# Patient Record
Sex: Male | Born: 1960 | Hispanic: No | Marital: Married | Smoking: Never smoker
Health system: Southern US, Community
[De-identification: ages and names within clinical notes are randomized; demographics above are authoritative.]

## PROBLEM LIST (undated history)

## (undated) DIAGNOSIS — I10 Essential (primary) hypertension: Secondary | ICD-10-CM

## (undated) DIAGNOSIS — D496 Neoplasm of unspecified behavior of brain: Secondary | ICD-10-CM

## (undated) HISTORY — PX: TONSILLECTOMY: SUR1361

---

## 2013-07-14 HISTORY — PX: CERVICAL SPINE SURGERY: SHX589

## 2013-07-24 ENCOUNTER — Emergency Department (HOSPITAL_COMMUNITY): Payer: BC Managed Care – PPO

## 2013-07-24 ENCOUNTER — Emergency Department (HOSPITAL_COMMUNITY)
Admission: EM | Admit: 2013-07-24 | Discharge: 2013-07-25 | Disposition: A | Discharge status: Home / Self Care | Payer: BC Managed Care – PPO | Attending: Emergency Medicine | Admitting: Emergency Medicine

## 2013-07-24 ENCOUNTER — Encounter (HOSPITAL_COMMUNITY): Payer: Self-pay | Admitting: Emergency Medicine

## 2013-07-24 DIAGNOSIS — S0993XA Unspecified injury of face, initial encounter: Secondary | ICD-10-CM | POA: Insufficient documentation

## 2013-07-24 DIAGNOSIS — Z79899 Other long term (current) drug therapy: Secondary | ICD-10-CM | POA: Insufficient documentation

## 2013-07-24 DIAGNOSIS — R5381 Other malaise: Secondary | ICD-10-CM | POA: Insufficient documentation

## 2013-07-24 DIAGNOSIS — IMO0002 Reserved for concepts with insufficient information to code with codable children: Secondary | ICD-10-CM | POA: Insufficient documentation

## 2013-07-24 DIAGNOSIS — Y93E1 Activity, personal bathing and showering: Secondary | ICD-10-CM | POA: Insufficient documentation

## 2013-07-24 DIAGNOSIS — R296 Repeated falls: Secondary | ICD-10-CM | POA: Insufficient documentation

## 2013-07-24 DIAGNOSIS — Z9889 Other specified postprocedural states: Secondary | ICD-10-CM | POA: Insufficient documentation

## 2013-07-24 DIAGNOSIS — S199XXA Unspecified injury of neck, initial encounter: Principal | ICD-10-CM

## 2013-07-24 DIAGNOSIS — W19XXXA Unspecified fall, initial encounter: Secondary | ICD-10-CM

## 2013-07-24 DIAGNOSIS — R5383 Other fatigue: Secondary | ICD-10-CM

## 2013-07-24 DIAGNOSIS — Y929 Unspecified place or not applicable: Secondary | ICD-10-CM | POA: Insufficient documentation

## 2013-07-24 LAB — URINALYSIS, ROUTINE W REFLEX MICROSCOPIC
Bilirubin Urine: NEGATIVE
Glucose, UA: NEGATIVE mg/dL
Hgb urine dipstick: NEGATIVE
Ketones, ur: NEGATIVE mg/dL
Leukocytes, UA: NEGATIVE
Nitrite: NEGATIVE
Protein, ur: NEGATIVE mg/dL
Specific Gravity, Urine: 1.017 (ref 1.005–1.030)
Urobilinogen, UA: 1 mg/dL (ref 0.0–1.0)
pH: 7.5 (ref 5.0–8.0)

## 2013-07-24 NOTE — ED Notes (Signed)
Patient transported to X-ray 

## 2013-07-24 NOTE — ED Notes (Signed)
Offered gown, but patient does not want a gown on.

## 2013-07-24 NOTE — ED Notes (Signed)
Spoke to Dr. Dorna Mai about removal of towel around neck for x-ray. MD allows removal, and notified Otila Kluver in [place].

## 2013-07-24 NOTE — ED Notes (Signed)
Removed stabliizer with Dr. Dorna Mai at the bedside.

## 2013-07-24 NOTE — ED Provider Notes (Signed)
CSN: [id number]     Arrival date & time 07/24/13  1830 History   First MD Initiated Contact with Patient 07/24/13 1931     Chief Complaint  Patient presents with  . Fall  . Post-op Problem     (Consider location/radiation/quality/duration/timing/severity/associated sxs/prior Treatment) HPI Comments: Pt tripped and fell in shower, partially aught by spouse and pt fell onto back.  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  Pt was in the hospital for 3 days, was walking improved at home, but family notes for the last day and a half, seems to have more trouble walking.  Legs seem weaker bilaterally, gait and balance are more difficult. Pt has only been taking advil for pain, avoiding oxycodone.  After fall, neck pain is not much worse, does not feel weaker or more numb to him subjectively.  Pt has weakness to right distal hand and wrist following surgery and seems to be the same to him.  No CP, HA, nausea.    Patient is a 53 y.o. male presenting with fall. The history is provided by the patient, the spouse and a relative.  Fall Pertinent negatives include no abdominal pain and no shortness of breath.    History reviewed. No pertinent past medical history. Past Surgical History  Procedure Laterality Date  . Cervical spine surgery  07/14/2013    Removed 1 Tumor  . Tonsillectomy     History reviewed. No pertinent family history. History  Substance Use Topics  . Smoking status: Never Smoker   . Smokeless tobacco: Not on file  . Alcohol Use: 1.8 oz/week    3 Cans of beer per week    Review of Systems  Constitutional: Negative for fever and chills.  Respiratory: Negative for shortness of breath.   Gastrointestinal: Negative for nausea, vomiting and abdominal pain.  Musculoskeletal: Positive for neck pain. Negative for back pain.  Skin: Negative for color change and rash.   Neurological: Positive for weakness. Negative for syncope.  All other systems reviewed and are negative.     Allergies  Review of patient's allergies indicates no known allergies.  Home Medications   Prior to Admission medications   Medication Sig Start Date End Date Taking? Authorizing Provider  baclofen (LIORESAL) 10 MG tablet Take 10 mg by mouth 3 (three) times daily. 07/20/13  Yes Historical Provider, MD  dexamethasone (DECADRON) 0.5 MG tablet Take 0.5 mg by mouth every 6 (six) hours.  07/17/13  Yes Historical Provider, MD  gabapentin (NEURONTIN) 400 MG capsule Take 400 mg by mouth 2 (two) times daily. 06/24/13  Yes Historical Provider, MD  ibuprofen (ADVIL,MOTRIN) 200 MG tablet Take 600 mg by mouth daily as needed for moderate pain.   Yes Historical Provider, MD  losartan (COZAAR) 100 MG tablet Take 100 mg by mouth daily. 05/26/13  Yes Historical Provider, MD  Multiple Vitamin (MULTIVITAMIN) capsule Take 1 capsule by mouth daily.   Yes Historical Provider, MD  Omega-3 Fatty Acids (FISH OIL PO) Take 1 capsule by mouth daily.   Yes Historical Provider, MD  oxyCODONE (OXY IR/ROXICODONE) 5 MG immediate release tablet Take 5 mg by mouth every 4 (four) hours as needed for severe pain.  07/17/13   Historical Provider, MD   BP 145/74  Pulse 108  Temp(Src) 99 F (37.2 C) (Oral)  Resp 18  SpO2 95% Physical Exam  Nursing note and  vitals reviewed. Constitutional: He is oriented to person, place, and time. He appears well-developed and well-nourished. No distress.  HENT:  Head: Normocephalic and atraumatic.  Eyes: EOM are normal.  Neck:  Incision appears well, no bleeding, discharge  Cardiovascular: Regular rhythm and intact distal pulses.   Pulmonary/Chest: Effort normal. No respiratory distress.  Abdominal: Soft. He exhibits no distension. There is no tenderness.  Musculoskeletal:       Cervical back: He exhibits tenderness, swelling and pain. He exhibits no spasm.       Thoracic  back: He exhibits no tenderness and no bony tenderness.       Lumbar back: He exhibits no tenderness and no bony tenderness.  Neurological: He is alert and oriented to person, place, and time. No sensory deficit. Coordination abnormal. GCS eye subscore is 4. GCS verbal subscore is 5. GCS motor subscore is 6.  Wrist drop and weak grip on right hand, 2/5 stregth for grip, sensation intact.  Poor heel shin bilaterally  Skin: Skin is warm. No rash noted.    ED Course  Procedures (including critical care time) Labs Review Labs Reviewed  URINALYSIS, ROUTINE W REFLEX MICROSCOPIC - Abnormal; Notable for the following:    APPearance HAZY (*)    All other components within normal limits    Imaging Review Dg Cervical Spine Complete  07/24/2013   CLINICAL DATA:  Status post fall; recent cervical surgery. Posterior neck pain.  EXAM: CERVICAL SPINE  4+ VIEWS  COMPARISON:  None.  FINDINGS: There is no evidence of fracture or subluxation. The patient is status post cervical spinal fusion from C4-T1; visualized hardware appears intact. There is no definite evidence of loosening. The lower cervical spine is not well assessed on lateral images due to overlying structures. Vertebral bodies demonstrate normal height and alignment. Intervertebral disc spaces are preserved. Prevertebral soft tissues are within normal limits. The provided odontoid view demonstrates no significant abnormality.  The visualized lung apices are clear.  IMPRESSION: No evidence of fracture or subluxation along the cervical spine. Cervical spinal fusion hardware appears intact, from C4-T1. No definite evidence of loosening. The lower cervical spine is not well assessed on lateral images due to overlying structures.   Electronically Signed   By: Garald Balding M.D.   On: 07/24/2013 23:10     EKG Interpretation None      10:41 PM Post void residual was slightly abn at 120 cc.  I discussed with neurosurgeon at [facility], Dr. Arsenio Katz who  feels that since pt had difficulty walking prior to surgery, he didn't expect after surgery he would be better, but certainly should be not worse.  He does not feel that the post void residual of 120 is significant in light of other normals such as good rectal tone.  He suggests getting plain films to make sure hardware is intact is reasonable.  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     11:31 PM Plain films neg, discussed at length with pt's family regarding my discussion with Dr. Arsenio Katz.  They understand if symtpoms worsen, to discuss with [facility] on call at [facility], certainly feel free to return at any time for any worse symptoms or concerns like urinary incontinence, worsening strength in lower extremitities  MDM   Final diagnoses:  Fall    Pt with good rectal tone, perineal sensation grossly normal.   Will check post void residual.  Heel shin is abn  and family reports leg strength and balance is worse in the past 1.5 days.  Will consider MRI of c spine or transfer to [facility] as I do not have access to pt's baseline neuro exam nor prior MRI's.      Saddie Benders. Dorna Mai, MD 07/24/13 2332

## 2013-07-24 NOTE — Discharge Instructions (Signed)

## 2013-07-24 NOTE — ED Notes (Addendum)
53 yo male via [facility], Pt slipped and fell or "sat down really hard" in the shower today. Event was witnessed by wife. Pt. Had Cervical Spine Surgery with Tumor removal on Wed 4/15 in [place]. Presents with Spinal Immobilization. Reports no LOC or pain below the site. Incision exhibits no new trauma and is intact. Pt is Alert and Oriented.    Vital 142/68 92 14 resp

## 2013-07-24 NOTE — ED Notes (Signed)
Patient back in the room from X-ray.

## 2013-07-24 NOTE — ED Notes (Addendum)
Pt. States that cervical spine surgery was performed at [facility] 4/15.  Side Effects of Surgery are Right Arm Weakness and Incontinence.

## 2013-07-24 NOTE — ED Notes (Signed)
Wife would like to be updated with any changes.  [facility] [phone].

## 2013-07-24 NOTE — ED Notes (Signed)
Plan of care discussed with Dr. Dorna Mai.  Patient will need residual post void, and is aware of the plan of care as well.

## 2013-07-24 NOTE — ED Notes (Signed)
Patient had incontinent episode.  Changed lined with EMT assist.

## 2013-07-24 NOTE — ED Notes (Signed)
Dr. Dorna Mai at the bedside to update family.  Patient still off the unit for x-ray.

## 2013-07-25 ENCOUNTER — Emergency Department (HOSPITAL_COMMUNITY): Payer: BC Managed Care – PPO

## 2013-07-25 ENCOUNTER — Emergency Department (HOSPITAL_COMMUNITY)
Admission: EM | Admit: 2013-07-25 | Discharge: 2013-07-25 | Disposition: A | Discharge status: Other Acute Inpatient Hospital | Payer: BC Managed Care – PPO | Attending: Emergency Medicine | Admitting: Emergency Medicine

## 2013-07-25 ENCOUNTER — Encounter (HOSPITAL_COMMUNITY): Payer: Self-pay | Admitting: Emergency Medicine

## 2013-07-25 DIAGNOSIS — Y838 Other surgical procedures as the cause of abnormal reaction of the patient, or of later complication, without mention of misadventure at the time of the procedure: Secondary | ICD-10-CM | POA: Insufficient documentation

## 2013-07-25 DIAGNOSIS — R4182 Altered mental status, unspecified: Secondary | ICD-10-CM | POA: Insufficient documentation

## 2013-07-25 DIAGNOSIS — Z79899 Other long term (current) drug therapy: Secondary | ICD-10-CM | POA: Insufficient documentation

## 2013-07-25 DIAGNOSIS — M542 Cervicalgia: Secondary | ICD-10-CM | POA: Insufficient documentation

## 2013-07-25 DIAGNOSIS — T8140XA Infection following a procedure, unspecified, initial encounter: Secondary | ICD-10-CM | POA: Insufficient documentation

## 2013-07-25 DIAGNOSIS — IMO0002 Reserved for concepts with insufficient information to code with codable children: Secondary | ICD-10-CM | POA: Insufficient documentation

## 2013-07-25 HISTORY — DX: Essential (primary) hypertension: I10

## 2013-07-25 HISTORY — DX: Neoplasm of unspecified behavior of brain: D49.6

## 2013-07-25 LAB — I-STAT CG4 LACTIC ACID, ED: Lactic Acid, Venous: 2.54 mmol/L — ABNORMAL HIGH (ref 0.5–2.2)

## 2013-07-25 LAB — COMPREHENSIVE METABOLIC PANEL
ALBUMIN: 2.1 g/dL — AB (ref 3.5–5.2)
ALT: 161 U/L — ABNORMAL HIGH (ref 0–53)
AST: 85 U/L — AB (ref 0–37)
Alkaline Phosphatase: 98 U/L (ref 39–117)
BUN: 16 mg/dL (ref 6–23)
CALCIUM: 7.8 mg/dL — AB (ref 8.4–10.5)
CO2: 20 mEq/L (ref 19–32)
Chloride: 103 mEq/L (ref 96–112)
Creatinine, Ser: 0.9 mg/dL (ref 0.50–1.35)
GFR calc Af Amer: >90 mL/min (ref >90)
GFR calc non Af Amer: >90 mL/min (ref >90)
Glucose, Bld: 233 mg/dL — ABNORMAL HIGH (ref 70–99)
Potassium: 3.5 mEq/L — ABNORMAL LOW (ref 3.7–5.3)
SODIUM: 139 meq/L (ref 137–147)
Total Bilirubin: 1 mg/dL (ref 0.3–1.2)
Total Protein: 5.6 g/dL — ABNORMAL LOW (ref 6.0–8.3)

## 2013-07-25 LAB — CBC WITH DIFFERENTIAL/PLATELET
BASOS ABS: 0 10*3/uL (ref 0.0–0.1)
Basophils Relative: 0 % (ref 0–1)
EOS ABS: 0 10*3/uL (ref 0.0–0.7)
Eosinophils Relative: 0 % (ref 0–5)
HCT: 40.9 % (ref 39.0–52.0)
Hemoglobin: 13.6 g/dL (ref 13.0–17.0)
Lymphocytes Relative: 19 % (ref 12–46)
Lymphs Abs: 1.1 10*3/uL (ref 0.7–4.0)
MCH: 28.9 pg (ref 26.0–34.0)
MCHC: 33.3 g/dL (ref 30.0–36.0)
MCV: 86.8 fL (ref 78.0–100.0)
Monocytes Absolute: 0.1 10*3/uL (ref 0.1–1.0)
Monocytes Relative: 2 % — ABNORMAL LOW (ref 3–12)
NEUTROS PCT: 78 % — AB (ref 43–77)
Neutro Abs: 4.6 10*3/uL (ref 1.7–7.7)
PLATELETS: 66 10*3/uL — AB (ref 150–400)
RBC: 4.71 MIL/uL (ref 4.22–5.81)
RDW: 14.9 % (ref 11.5–15.5)
WBC: 5.9 10*3/uL (ref 4.0–10.5)

## 2013-07-25 LAB — URINALYSIS, ROUTINE W REFLEX MICROSCOPIC
Bilirubin Urine: NEGATIVE
GLUCOSE, UA: NEGATIVE mg/dL
KETONES UR: NEGATIVE mg/dL
LEUKOCYTES UA: NEGATIVE
Nitrite: NEGATIVE
PH: 7.5 (ref 5.0–8.0)
Protein, ur: 30 mg/dL — AB
Specific Gravity, Urine: 1.019 (ref 1.005–1.030)
Urobilinogen, UA: 1 mg/dL (ref 0.0–1.0)

## 2013-07-25 LAB — URINE MICROSCOPIC-ADD ON

## 2013-07-25 MED ORDER — FENTANYL CITRATE 0.05 MG/ML IJ SOLN
INTRAMUSCULAR | Status: AC
  Filled 2013-07-25: qty 2

## 2013-07-25 MED ORDER — SODIUM CHLORIDE 0.9 % IV SOLN: INTRAVENOUS | Status: DC

## 2013-07-25 MED ORDER — DEXTROSE 5 % IV SOLN
2.0000 g | Freq: Once | INTRAVENOUS | Status: AC
  Administered 2013-07-25: 2 g via INTRAVENOUS
  Filled 2013-07-25: qty 2

## 2013-07-25 MED ORDER — SODIUM CHLORIDE 0.9 % IV SOLN
1.0000 g | Freq: Once | INTRAVENOUS | Status: AC
  Administered 2013-07-25: 1 g via INTRAVENOUS
  Filled 2013-07-25: qty 1000

## 2013-07-25 MED ORDER — ACETAMINOPHEN 650 MG RE SUPP
975.0000 mg | Freq: Once | RECTAL | Status: AC
  Administered 2013-07-25: 975 mg via RECTAL
  Filled 2013-07-25 (×2): qty 1

## 2013-07-25 MED ORDER — SODIUM CHLORIDE 0.9 % IV BOLUS (SEPSIS)
1000.0000 mL | Freq: Once | INTRAVENOUS | Status: AC
  Administered 2013-07-25: 1000 mL via INTRAVENOUS

## 2013-07-25 MED ORDER — FENTANYL CITRATE 0.05 MG/ML IJ SOLN
INTRAMUSCULAR | Status: DC | PRN
  Administered 2013-07-25: 100 ug via INTRAVENOUS

## 2013-07-25 MED ORDER — VANCOMYCIN HCL IN DEXTROSE 1-5 GM/200ML-% IV SOLN
1000.0000 mg | Freq: Once | INTRAVENOUS | Status: AC
  Administered 2013-07-25: 1000 mg via INTRAVENOUS
  Filled 2013-07-25: qty 200

## 2013-07-25 NOTE — ED Provider Notes (Addendum)
CSN: [id number]     Arrival date & time 07/25/13  1020 History   First MD Initiated Contact with Patient 07/25/13 1024     Chief Complaint  Patient presents with  . Code Sepsis     (Consider location/radiation/quality/duration/timing/severity/associated sxs/prior Treatment) The history is provided by the patient and the EMS personnel. The history is limited by the condition of the patient.   Patient here with acute onset of temperature up to 108.3 per EMS. Patient had spine surgery 2 weeks ago at [facility]. Was seen in the ER last night after a fall. Plain x-rays were negative at that time for acute injury. Patient is also had altered mental status. No reported vomiting. No cough or congestion. No urinary symptoms noted. No further history obtainable due to his condition.  No past medical history on file. Past Surgical History  Procedure Laterality Date  . Cervical spine surgery  07/14/2013    Removed 1 Tumor  . Tonsillectomy     No family history on file. History  Substance Use Topics  . Smoking status: Never Smoker   . Smokeless tobacco: Not on file  . Alcohol Use: 1.8 oz/week    3 Cans of beer per week    Review of Systems  All other systems reviewed and are negative.     Allergies  Review of patient's allergies indicates no known allergies.  Home Medications   Prior to Admission medications   Medication Sig Start Date End Date Taking? Authorizing Provider  baclofen (LIORESAL) 10 MG tablet Take 10 mg by mouth 3 (three) times daily. 07/20/13   Historical Provider, MD  dexamethasone (DECADRON) 0.5 MG tablet Take 0.5 mg by mouth every 6 (six) hours.  07/17/13   Historical Provider, MD  gabapentin (NEURONTIN) 400 MG capsule Take 400 mg by mouth 2 (two) times daily. 06/24/13   Historical Provider, MD  ibuprofen (ADVIL,MOTRIN) 200 MG tablet Take 600 mg by mouth daily as needed for moderate pain.    Historical Provider, MD  losartan (COZAAR) 100 MG tablet Take 100 mg by mouth daily.  05/26/13   Historical Provider, MD  Multiple Vitamin (MULTIVITAMIN) capsule Take 1 capsule by mouth daily.    Historical Provider, MD  Omega-3 Fatty Acids (FISH OIL PO) Take 1 capsule by mouth daily.    Historical Provider, MD  oxyCODONE (OXY IR/ROXICODONE) 5 MG immediate release tablet Take 5 mg by mouth every 4 (four) hours as needed for severe pain.  07/17/13   Historical Provider, MD   There were no vitals taken for this visit. Physical Exam  Nursing note and vitals reviewed. Constitutional: He is oriented to person, place, and time. He appears well-developed and well-nourished.  Non-toxic appearance. No distress.  HENT:  Head: Normocephalic and atraumatic.  Eyes: Conjunctivae, EOM and lids are normal. Pupils are equal, round, and reactive to light.  Neck: Neck supple. Spinous process tenderness and muscular tenderness present. Decreased range of motion present. No tracheal deviation present. No mass present.    Cardiovascular: Normal rate, regular rhythm and normal heart sounds.  Exam reveals no gallop.   No murmur heard. Pulmonary/Chest: Effort normal and breath sounds normal. No stridor. No respiratory distress. He has no decreased breath sounds. He has no wheezes. He has no rhonchi. He has no rales.  Abdominal: Soft. Normal appearance and bowel sounds are normal. He exhibits no distension. There is no tenderness. There is no rebound and no CVA tenderness.  Musculoskeletal: He exhibits no edema and no tenderness.  Neurological: He is alert and oriented to person, place, and time. He has normal strength. No cranial nerve deficit or sensory deficit. GCS eye subscore is 4. GCS verbal subscore is 5. GCS motor subscore is 6.  Skin: Skin is warm and dry. No abrasion and no rash noted.  Psychiatric: His affect is blunt. His speech is delayed. He is slowed.    ED Course  Procedures (including critical care time) Labs Review Labs Reviewed  CULTURE, BLOOD (ROUTINE X 2)  CULTURE, BLOOD  (ROUTINE X 2)  URINE CULTURE  URINALYSIS, ROUTINE W REFLEX MICROSCOPIC  CBC WITH DIFFERENTIAL  COMPREHENSIVE METABOLIC PANEL  I-STAT CG4 LACTIC ACID, ED    Imaging Review Dg Cervical Spine Complete  07/24/2013   CLINICAL DATA:  Status post fall; recent cervical surgery. Posterior neck pain.  EXAM: CERVICAL SPINE  4+ VIEWS  COMPARISON:  None.  FINDINGS: There is no evidence of fracture or subluxation. The patient is status post cervical spinal fusion from C4-T1; visualized hardware appears intact. There is no definite evidence of loosening. The lower cervical spine is not well assessed on lateral images due to overlying structures. Vertebral bodies demonstrate normal height and alignment. Intervertebral disc spaces are preserved. Prevertebral soft tissues are within normal limits. The provided odontoid view demonstrates no significant abnormality.  The visualized lung apices are clear.  IMPRESSION: No evidence of fracture or subluxation along the cervical spine. Cervical spinal fusion hardware appears intact, from C4-T1. No definite evidence of loosening. The lower cervical spine is not well assessed on lateral images due to overlying structures.   Electronically Signed   By: Garald Balding M.D.   On: 07/24/2013 23:10     EKG Interpretation None      MDM   Final diagnoses:  None    Patient given rectal Tylenol and placed on a cooling blanket. His mental status gradually improved. Was started empirically on ampicillin, Rocephin, vancomycin. Spoke with the neurosurgeon  at [facility] and patient will be transferred there. Patient's blood pressures remain stable. His dose as has improved. He is stable for transfer.   CRITICAL CARE Performed by: Leota Jacobsen Total critical care time: 50 Critical care time was exclusive of separately billable procedures and treating other patients. Critical care was necessary to treat or prevent imminent or life-threatening deterioration. Critical  care was time spent personally by me on the following activities: development of treatment plan with patient and/or surrogate as well as nursing, discussions with consultants, evaluation of patient's response to treatment, examination of patient, obtaining history from patient or surrogate, ordering and performing treatments and interventions, ordering and review of laboratory studies, ordering and review of radiographic studies, pulse oximetry and re-evaluation of patient's condition.     Leota Jacobsen, MD 07/25/13 1049  Leota Jacobsen, MD 07/25/13 [phone]

## 2013-07-25 NOTE — ED Notes (Signed)
Dr Zenia Resides aware of BP and no treatment given

## 2013-07-25 NOTE — ED Notes (Signed)
Per dr Zenia Resides, pt accepted at [facility], awaitng bed assignement and then will call for transport.

## 2013-07-25 NOTE — ED Notes (Signed)
COOLING Blanket started

## 2013-07-25 NOTE — ED Notes (Signed)
carelink at bedside 

## 2013-07-25 NOTE — ED Notes (Signed)
Care of patient assumed, Pt responding to this rn on assessment.

## 2013-07-25 NOTE — ED Notes (Signed)
Pt ED from home for treatment after going unresponsive. Pt found by EMS laying on floor. Pt had back surgery 2 wks ago at [facility], seen in ED last night after a fall. Per EMS, pt had 99 degree temp. 108.3 temp per EMS. Pt packed with ice to groin and armpits. Pt rectal temp on arrival 107.2. Pt unclothed, ice, and rectal tylenol given by ED staff. Pt communicating now, able to say name and knows he is in ED

## 2013-07-26 ENCOUNTER — Telehealth (HOSPITAL_BASED_OUTPATIENT_CLINIC_OR_DEPARTMENT_OTHER): Payer: Self-pay | Admitting: *Deleted

## 2013-07-26 LAB — URINE CULTURE
CULTURE: NO GROWTH
Colony Count: NO GROWTH

## 2013-07-26 NOTE — Telephone Encounter (Signed)
Called and gave +blood culture report of both sets of anaerobic and aerobic bottles were growing gram negative rods.  Reported to RN Rufina Falco at [facility].

## 2013-07-26 NOTE — Telephone Encounter (Signed)
Called to report Gram - rods in all four blood culture bottles drawn.

## 2013-07-28 LAB — CULTURE, BLOOD (ROUTINE X 2)

## 2015-11-18 IMAGING — CR DG CERVICAL SPINE COMPLETE 4+V
7 series · 7 of 7 positions shown · non-contrast
Comparison: None.

CLINICAL DATA: Status post fall; recent cervical surgery. Posterior
neck pain.

EXAM:
CERVICAL SPINE  4+ VIEWS

[t c-spine a.p.]
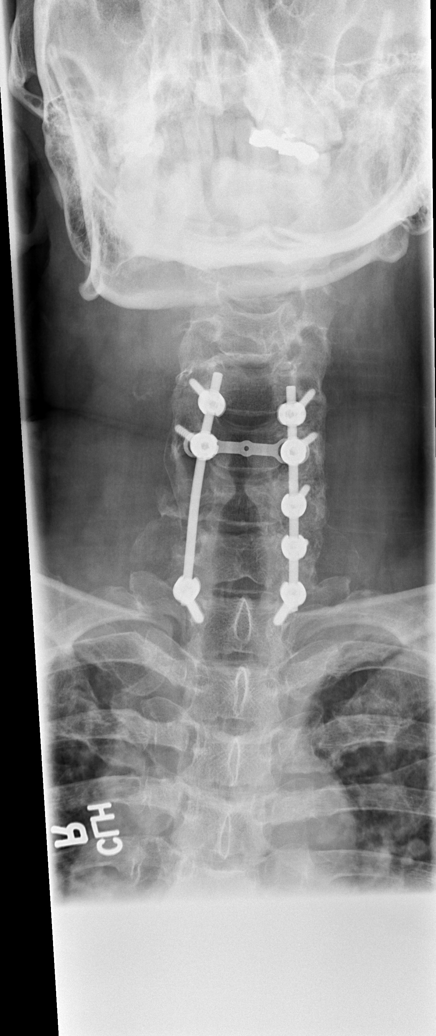

[t c-spine odontoid (1 of 2)]
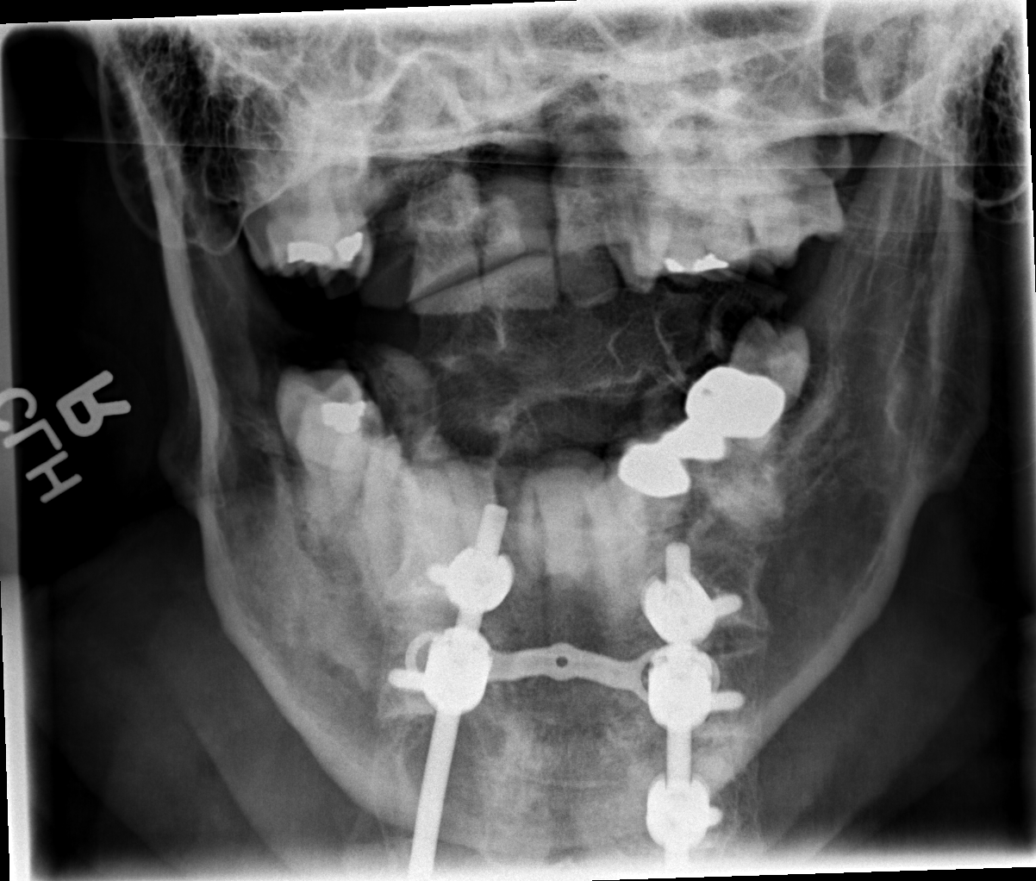

[t c-spine odontoid (2 of 2)]
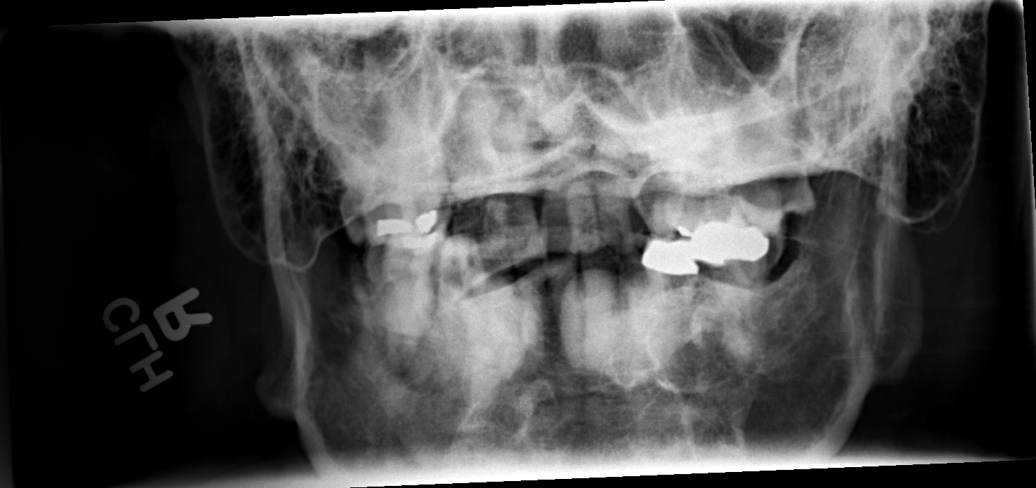

[t c-spine oblique (1 of 2)]
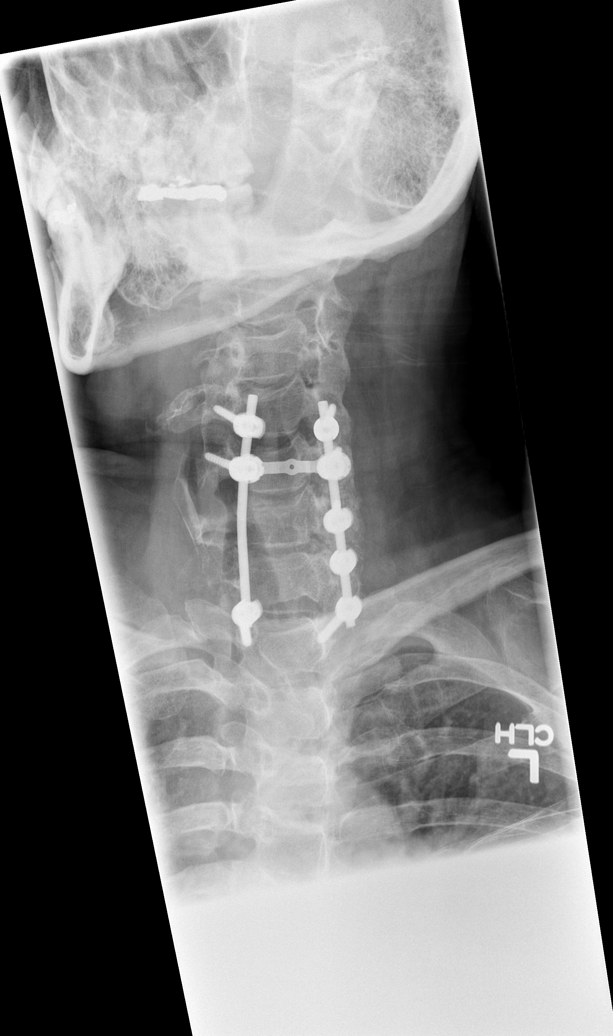

[t c-spine oblique (2 of 2)]
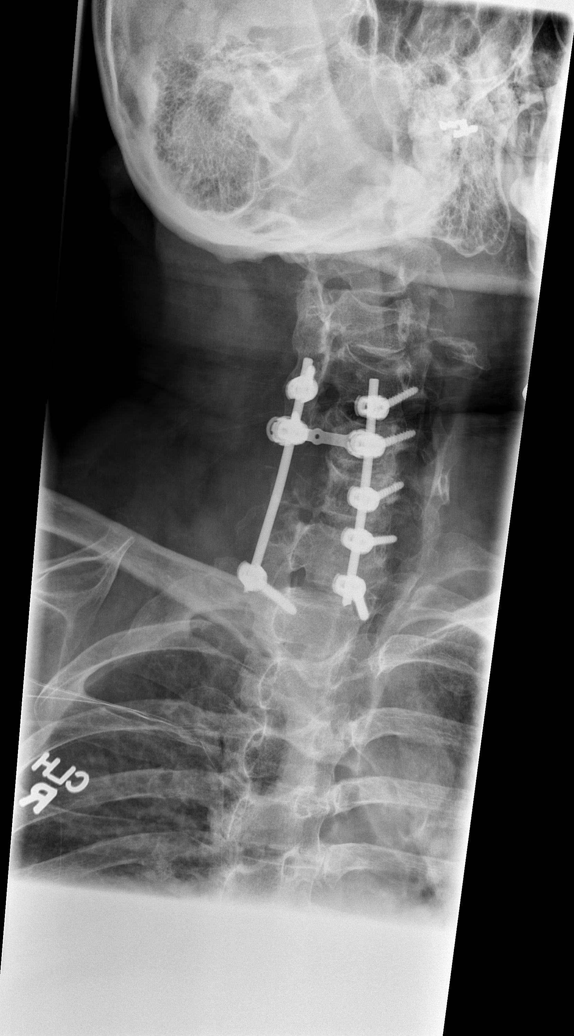

[w c-spine lat]
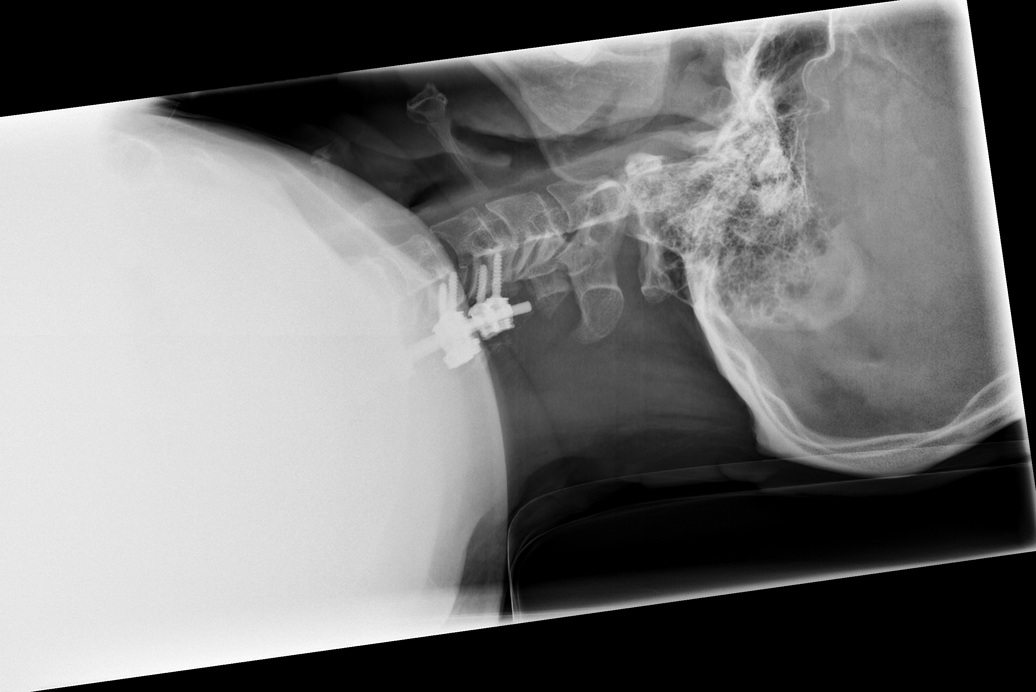

[w swimmers view *]
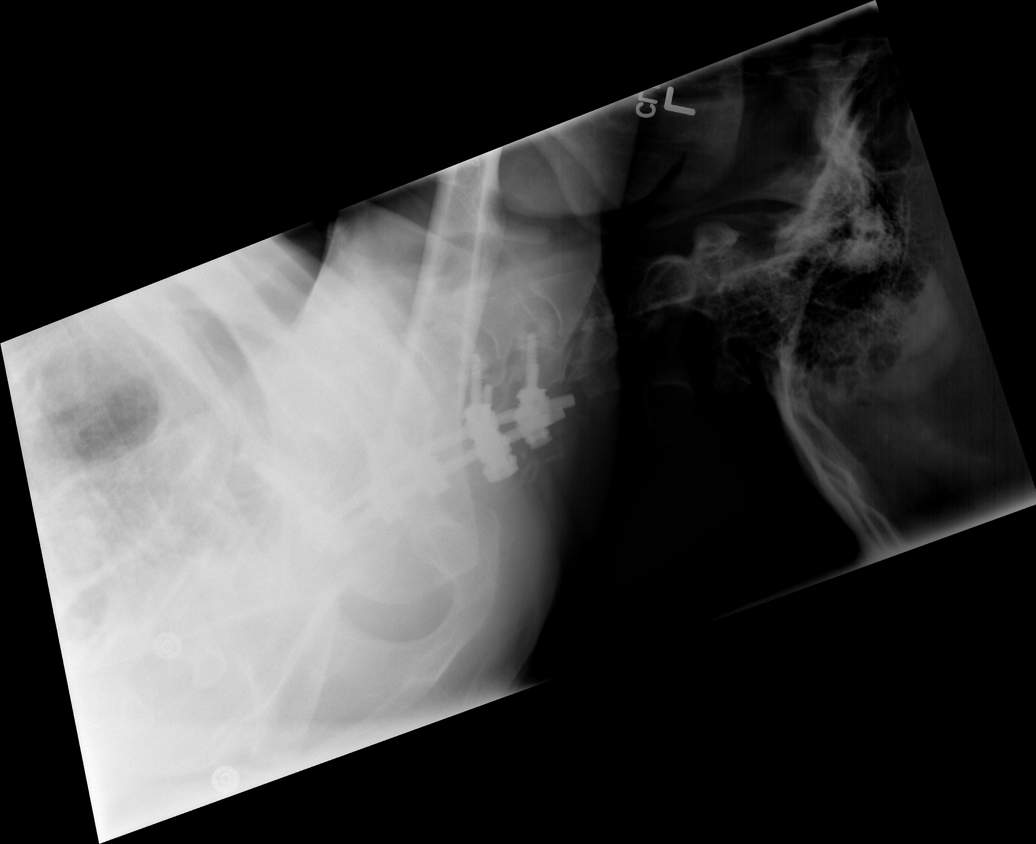

[7 of 7 positions shown; findings below may reference images not displayed]

FINDINGS: There is no evidence of fracture or subluxation. The patient is
status post cervical spinal fusion from C4-T1; visualized hardware
appears intact. There is no definite evidence of loosening. The
lower cervical spine is not well assessed on lateral images due to
overlying structures. Vertebral bodies demonstrate normal height and
alignment. Intervertebral disc spaces are preserved. Prevertebral
soft tissues are within normal limits. The provided odontoid view
demonstrates no significant abnormality.

The visualized lung apices are clear.
IMPRESSION: No evidence of fracture or subluxation along the cervical spine.
Cervical spinal fusion hardware appears intact, from C4-T1. No
definite evidence of loosening. The lower cervical spine is not well
assessed on lateral images due to overlying structures.

## 2019-12-30 ENCOUNTER — Ambulatory Visit: Payer: BC Managed Care – PPO | Attending: Family

## 2019-12-30 DIAGNOSIS — Z23 Encounter for immunization: Secondary | ICD-10-CM

## 2020-08-25 ENCOUNTER — Other Ambulatory Visit: Payer: Self-pay

## 2020-08-25 ENCOUNTER — Ambulatory Visit: Payer: Self-pay | Attending: Family

## 2020-08-25 DIAGNOSIS — Z23 Encounter for immunization: Secondary | ICD-10-CM

## 2020-08-25 NOTE — Progress Notes (Signed)
   Covid-19 Vaccination Clinic  Name:  Kendrew Paci    MRN: [id number] DOB: May 28, 1960  08/25/2020  Mr. Smiles was observed post Covid-19 immunization for 15 minutes without incident. He was provided with Vaccine Information Sheet and instruction to access the V-Safe system.   Mr. Beza was instructed to call 911 with any severe reactions post vaccine: Marland Kitchen Difficulty breathing  . Swelling of face and throat  . A fast heartbeat  . A bad rash all over body  . Dizziness and weakness   Immunizations Administered    Name Date Dose VIS Date Route   Moderna Covid-19 Booster Vaccine 08/25/2020  3:15 PM 0.25 mL 01/19/2020 Intramuscular   Manufacturer: Moderna   Lot: [id number]   [place]: [id number]

## 2021-04-06 ENCOUNTER — Ambulatory Visit: Payer: Self-pay | Attending: Family

## 2021-04-06 DIAGNOSIS — Z23 Encounter for immunization: Secondary | ICD-10-CM

## 2021-04-06 NOTE — Progress Notes (Signed)
° °  Covid-19 Vaccination Clinic  Name:  Kurt Cook    MRN: [id number] DOB: 1960-10-11  04/06/2021  [name] was observed post Covid-19 immunization for 15 minutes without incident. He was provided with Vaccine Information Sheet and instruction to access the V-Safe system.   [name] was instructed to call 911 with any severe reactions post vaccine: Difficulty breathing  Swelling of face and throat  A fast heartbeat  A bad rash all over body  Dizziness and weakness   Immunizations Administered     Name Date Dose VIS Date Route   Moderna Covid-19 vaccine Bivalent Booster 04/06/2021  2:00 PM 0.5 mL 11/11/2020 Intramuscular   Manufacturer: Moderna   Lot: [id number]   [place]: [id number]

## 2021-08-18 ENCOUNTER — Emergency Department (HOSPITAL_COMMUNITY)
Admission: EM | Admit: 2021-08-18 | Discharge: 2021-08-18 | Disposition: A | Discharge status: Home / Self Care | Payer: BC Managed Care – PPO | Attending: Emergency Medicine | Admitting: Emergency Medicine

## 2021-08-18 ENCOUNTER — Emergency Department (HOSPITAL_COMMUNITY): Payer: BC Managed Care – PPO

## 2021-08-18 ENCOUNTER — Other Ambulatory Visit: Payer: Self-pay

## 2021-08-18 ENCOUNTER — Encounter (HOSPITAL_COMMUNITY): Payer: Self-pay

## 2021-08-18 DIAGNOSIS — R55 Syncope and collapse: Secondary | ICD-10-CM | POA: Diagnosis present

## 2021-08-18 DIAGNOSIS — I6521 Occlusion and stenosis of right carotid artery: Secondary | ICD-10-CM

## 2021-08-18 LAB — TROPONIN I (HIGH SENSITIVITY)
Troponin I (High Sensitivity): 4 ng/L (ref <18)
Troponin I (High Sensitivity): 3 ng/L (ref <18)

## 2021-08-18 LAB — COMPREHENSIVE METABOLIC PANEL
ALT: 29 U/L (ref 0–44)
AST: 42 U/L — ABNORMAL HIGH (ref 15–41)
Albumin: 3.4 g/dL — ABNORMAL LOW (ref 3.5–5.0)
Alkaline Phosphatase: 51 U/L (ref 38–126)
Anion gap: 13 (ref 5–15)
BUN: 11 mg/dL (ref 6–20)
CO2: 22 mmol/L (ref 22–32)
Calcium: 8.4 mg/dL — ABNORMAL LOW (ref 8.9–10.3)
Chloride: 102 mmol/L (ref 98–111)
Creatinine, Ser: 1.08 mg/dL (ref 0.61–1.24)
GFR, Estimated: >60 mL/min (ref >60)
Glucose, Bld: 105 mg/dL — ABNORMAL HIGH (ref 70–99)
Potassium: 4.5 mmol/L (ref 3.5–5.1)
Sodium: 137 mmol/L (ref 135–145)
Total Bilirubin: 1 mg/dL (ref 0.3–1.2)
Total Protein: 6.2 g/dL — ABNORMAL LOW (ref 6.5–8.1)

## 2021-08-18 LAB — CBC WITH DIFFERENTIAL/PLATELET
Abs Immature Granulocytes: 0 10*3/uL (ref 0.00–0.07)
Basophils Absolute: 0 10*3/uL (ref 0.0–0.1)
Basophils Relative: 1 %
Eosinophils Absolute: 0.2 10*3/uL (ref 0.0–0.5)
Eosinophils Relative: 4 %
HCT: 41.2 % (ref 39.0–52.0)
Hemoglobin: 13.5 g/dL (ref 13.0–17.0)
Immature Granulocytes: 0 %
Lymphocytes Relative: 60 %
Lymphs Abs: 3.1 10*3/uL (ref 0.7–4.0)
MCH: 29.3 pg (ref 26.0–34.0)
MCHC: 32.8 g/dL (ref 30.0–36.0)
MCV: 89.4 fL (ref 80.0–100.0)
Monocytes Absolute: 0.5 10*3/uL (ref 0.1–1.0)
Monocytes Relative: 10 %
Neutro Abs: 1.3 10*3/uL — ABNORMAL LOW (ref 1.7–7.7)
Neutrophils Relative %: 25 %
Platelets: 212 10*3/uL (ref 150–400)
RBC: 4.61 MIL/uL (ref 4.22–5.81)
RDW: 14.4 % (ref 11.5–15.5)
WBC: 5.2 10*3/uL (ref 4.0–10.5)
nRBC: 0 % (ref 0.0–0.2)

## 2021-08-18 LAB — D-DIMER, QUANTITATIVE: D-Dimer, Quant: <0.27 ug/mL-FEU (ref 0.00–0.50)

## 2021-08-18 LAB — URINALYSIS, ROUTINE W REFLEX MICROSCOPIC
Bilirubin Urine: NEGATIVE
Glucose, UA: NEGATIVE mg/dL
Hgb urine dipstick: NEGATIVE
Ketones, ur: NEGATIVE mg/dL
Leukocytes,Ua: NEGATIVE
Nitrite: NEGATIVE
Protein, ur: NEGATIVE mg/dL
Specific Gravity, Urine: 1.032 — ABNORMAL HIGH (ref 1.005–1.030)
pH: 5 (ref 5.0–8.0)

## 2021-08-18 LAB — CBG MONITORING, ED: Glucose-Capillary: 93 mg/dL (ref 70–99)

## 2021-08-18 MED ORDER — IOHEXOL 350 MG/ML SOLN
80.0000 mL | Freq: Once | INTRAVENOUS | Status: AC | PRN
  Administered 2021-08-18: 80 mL via INTRAVENOUS

## 2021-08-18 NOTE — ED Provider Notes (Signed)
[facility] EMERGENCY DEPARTMENT Provider Note   CSN: [id number] Arrival date & time: 08/18/21  7062     History  Chief Complaint  Patient presents with   Loss of Consciousness    Kurt Cook is a 61 y.o. male who presents with a cc of near syncope or abnormal gait. 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  He caught himself, tried to continue toward the bathroom and then stumbled hitting the wall.  His wife states she walked over and helped him down to the floor.  She states that he was turning gray.  She called EMS who noted that the patient was orthostatic and hypotensive into the 37S systolic.  He was given 400 mL of fluid prior to arrival with improvement.    He had surgical removal at [facility] in 2015.  He states that since that time he walks with a limp on the right.  He denies racing or skipping in his heart, black or tarry bowel movements, stomach pain.  He does have a history of adenomatous colon polyps.  He is not on any blood thinners.  He denies chest pain, shortness of breath, headaches, changes in vision, unilateral with weakness, difficulty with speech or swallowing.      Loss of Consciousness     Home Medications Prior to Admission medications   Medication Sig Start Date End Date Taking? Authorizing Provider  baclofen (LIORESAL) 10 MG tablet Take 10 mg by mouth 3 (three) times daily. 07/20/13   [provider]  dexamethasone (DECADRON) 0.5 MG tablet Take 0.5 mg by mouth every 6 (six) hours.  07/17/13   [provider]  gabapentin (NEURONTIN) 400 MG capsule Take 400 mg by mouth 2 (two) times daily. 06/24/13   [provider]  ibuprofen (ADVIL,MOTRIN) 200 MG tablet Take 600 mg by mouth daily as needed for moderate pain.    [provider]   losartan (COZAAR) 100 MG tablet Take 100 mg by mouth daily. 05/26/13   [provider]  Multiple Vitamin (MULTIVITAMIN) capsule Take 1 capsule by mouth daily.    [provider]  Omega-3 Fatty Acids (FISH OIL PO) Take 1 capsule by mouth daily.    [provider]  oxyCODONE (OXY IR/ROXICODONE) 5 MG immediate release tablet Take 5 mg by mouth every 4 (four) hours as needed for severe pain.  07/17/13   [provider]      Allergies    Patient has no known allergies.    Review of Systems   Review of Systems  Cardiovascular:  Positive for syncope.   Physical Exam Updated Vital Signs BP (!) 109/56   Pulse 95   Temp 98.1 F (36.7 C) (Oral)   Resp 20   Ht 5' 10.5" (1.791 m)   Wt 90.7 kg   SpO2 99%   BMI 28.29 kg/m  Physical Exam Vitals and nursing note reviewed.  Constitutional:      General: He is not in acute distress.    Appearance: He is well-developed. He is not diaphoretic.  HENT:     Head: Normocephalic and atraumatic.  Eyes:     General: No visual field deficit or scleral icterus.    Conjunctiva/sclera: Conjunctivae normal.  Cardiovascular:     Rate and Rhythm: Normal rate and regular rhythm.  Heart sounds: Normal heart sounds.  Pulmonary:     Effort: Pulmonary effort is normal. No respiratory distress.     Breath sounds: Normal breath sounds.  Abdominal:     Palpations: Abdomen is soft.     Tenderness: There is no abdominal tenderness.  Musculoskeletal:     Cervical back: Normal range of motion and neck supple.  Skin:    General: Skin is warm and dry.  Neurological:     Mental Status: He is alert and oriented to person, place, and time.     GCS: GCS eye subscore is 4. GCS verbal subscore is 5. GCS motor subscore is 6.     Cranial Nerves: Cranial nerves 2-12 are intact. No cranial nerve deficit, dysarthria or facial asymmetry.     Sensory: Sensation is intact.     Motor: Motor function is intact. No weakness, seizure  activity or pronator drift.     Coordination: Romberg sign negative. Heel to [facility] Test abnormal (abnl left sided H-S).     Deep Tendon Reflexes:     Reflex Scores:      Patellar reflexes are 1+ on the right side and 3+ on the left side. Psychiatric:        Behavior: Behavior normal.    ED Results / Procedures / Treatments   Labs (all labs ordered are listed, but only abnormal results are displayed) Labs Reviewed  CBC WITH DIFFERENTIAL/PLATELET - Abnormal; Notable for the following components:      Result Value   Neutro Abs 1.3 (*)    All other components within normal limits  COMPREHENSIVE METABOLIC PANEL - Abnormal; Notable for the following components:   Glucose, Bld 105 (*)    Calcium 8.4 (*)    Total Protein 6.2 (*)    Albumin 3.4 (*)    AST 42 (*)    All other components within normal limits  URINALYSIS, ROUTINE W REFLEX MICROSCOPIC - Abnormal; Notable for the following components:   Specific Gravity, Urine 1.032 (*)    All other components within normal limits  D-DIMER, QUANTITATIVE  CBG MONITORING, ED  TROPONIN I (HIGH SENSITIVITY)  TROPONIN I (HIGH SENSITIVITY)    EKG EKG Interpretation  Date/Time:  Saturday Aug 18 2021 08:35:30 EDT Ventricular Rate:  92 PR Interval:  199 QRS Duration: 88 QT Interval:  373 QTC Calculation: 462 R Axis:   -14 Text Interpretation: Sinus rhythm Borderline prolonged PR interval Abnormal R-wave progression, early transition when compared to prior,  new t wave inversion in lead 3. No STEMI Confirmed by Antony Blackbird [phone]) on 08/18/2021 10:29:04 AM  Radiology CT ANGIO HEAD NECK W WO CM  Result Date: 08/18/2021 CLINICAL DATA:  61 year old male "ataxia, head trauma, loss of consciousness" . EXAM: CT ANGIOGRAPHY HEAD AND NECK TECHNIQUE: Multidetector CT imaging of the head and neck was performed using the standard protocol during bolus administration of intravenous contrast. Multiplanar CT image reconstructions and MIPs were obtained  to evaluate the vascular anatomy. Carotid stenosis measurements (when applicable) are obtained utilizing NASCET criteria, using the distal internal carotid diameter as the denominator. RADIATION DOSE REDUCTION: This exam was performed according to the departmental dose-optimization program which includes automated exposure control, adjustment of the mA and/or kV according to patient size and/or use of iterative reconstruction technique. CONTRAST:  85m OMNIPAQUE IOHEXOL 350 MG/ML SOLN COMPARISON:  None Available. FINDINGS: CT HEAD Brain: Calcified atherosclerosis at the skull base. Fairly normal cerebral volume for age. No midline shift, ventriculomegaly, mass effect, evidence of  mass lesion, intracranial hemorrhage or evidence of cortically based acute infarction. Gray-white matter differentiation appears symmetric and within normal limits for age. Calvarium and skull base: No acute osseous abnormality identified. Paranasal sinuses: Mild to moderate paranasal sinus mucosal thickening primarily in the frontoethmoidal recesses and ethmoid air cells. Tympanic cavities, mastoid, petrous apex, and sphenoid wing air cells are clear. Orbits: No acute orbit or scalp soft tissue finding. CTA NECK Skeleton: Torus mandibularis, normal variant. Previous cervical spine posterior decompression and fusion. No acute osseous abnormality identified. Upper chest: Minor dependent upper lung atelectasis. Visible central pulmonary arteries appear patent. No superior mediastinal lymphadenopathy. Other neck: Chronic postoperative changes to the posterior paraspinal soft tissues. No acute finding. Aortic arch: 3 vessel arch configuration. Mild Calcified aortic atherosclerosis. Right carotid system: No significant brachiocephalic artery or right CCA plaque or stenosis. But bulky soft more so than calcified plaque at the right ICA origin and bulb. Stenosis up to 50 % with respect to the distal vessel. Right ICA remains patent to the skull  base. Left carotid system: Minor plaque at the left CCA origin without stenosis. Calcified plaque at the left ICA origin without stenosis. Vertebral arteries: Mild calcified plaque at the right subclavian artery origin without stenosis. Normal right vertebral artery origin. Patent right vertebral artery to the skull base with no significant plaque or stenosis. Mild plaque proximal left subclavian artery without stenosis. Normal left vertebral artery origin. Codominant left vertebral artery is patent to the skull base with no significant plaque or stenosis. CTA HEAD Posterior circulation: Patent distal vertebral arteries and vertebrobasilar junction with no significant plaque or stenosis. Bilateral AICA appear dominant and patent. Patent basilar artery without stenosis. Patent SCA and PCA origins. Posterior communicating arteries are diminutive or absent. Bilateral ACA branches are within normal limits. Anterior circulation: Both ICA siphons are patent. Mild left siphon calcified plaque without stenosis. Mild to moderate right siphon calcified plaque most pronounced in the cavernous segment. Mild cavernous stenosis. Patent carotid termini, MCA and ACA origins. Left A1 appears dominant. Normal anterior communicating artery. Bilateral ACA branches are within normal limits. Left MCA M1 segment and bifurcation are patent without stenosis. Right MCA M1 segment and bifurcation are patent without stenosis. Bilateral MCA branches are within normal limits. Venous sinuses: Appear patent. Anatomic variants: Dominant left ACA A1. Review of the MIP images confirms the above findings IMPRESSION: 1. Negative for large vessel occlusion. Positive for atherosclerosis, most pronounced at the Right ICA origin and bulb, but no hemodynamically significant stenosis. 2. Normal for age non contrast CT appearance of the brain. 3. Mild to moderate paranasal sinus inflammation. 4. Previous cervical spine decompression and fusion. 5. Aortic  Atherosclerosis (ICD10-I70.0). Electronically Signed   By: Genevie Ann M.D.   On: 08/18/2021 08:18   DG Chest Portable 1 View  Result Date: 08/18/2021 CLINICAL DATA:  61 year old male with syncope. Unresponsive, hypotensive. EXAM: PORTABLE CHEST 1 VIEW COMPARISON:  Portable chest 07/25/2013. FINDINGS: Portable AP semi upright view at 0704 hours. Partially visible chronic lower cervical posterior fusion hardware. Chronically somewhat low lung volumes. Normal cardiac size and mediastinal contours. Visualized tracheal air column is within normal limits. Allowing for portable technique the lungs are clear. No pneumothorax or pleural effusion. Negative visible bowel gas. No acute osseous abnormality identified. IMPRESSION: Negative portable chest. Electronically Signed   By: Genevie Ann M.D.   On: 08/18/2021 07:27    Procedures Procedures    Medications Ordered in ED Medications  iohexol (OMNIPAQUE) 350 MG/ML injection 80 mL (  80 mLs Intravenous Contrast Given 08/18/21 0758)    ED Course/ Medical Decision Making/ A&P Clinical Course as of 08/18/21 1528  Sat Aug 18, 2021  1132 D-dimer, quantitative [AH]  1132 Comprehensive metabolic panel(!) [AH]  0865 CBC with Differential(!) [AH]  1132 CBG monitoring, ED [AH]  1132 Troponin I (High Sensitivity) [AH]  1132 CT ANGIO HEAD NECK W WO CM I visualized and interpreted CTA head and neck.  He has some plaquing of the right ICA.  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 Patient notably hypotensive  prior to arrival after just standing from waking up in the morning.\ On exam the patient exhibits no evidence of ataxia or balance issues.  He has some abnormalities with heel-to-shin exam and this is likely chronic due to his previous spinal surgery and tumor and patient states that he always limps on the right side. Given the physical exam findings I have higher suspicion for near syncope. The differential for syncope is extensive and includes, but is not limited to: arrythmia (Vtach, SVT, SSS, sinus arrest, AV block, bradycardia) aortic stenosis, AMI, HOCM, PE, atrial myxoma, pulmonary hypertension, orthostatic hypotension, (hypovolemia, drug effect, GB syndrome, micturition, cough, swall) carotid sinus sensitivity, Seizure, TIA/CVA, hypoglycemia,  Vertigo. After review of all data points I have no suspicion for pulmonary embolus as D-dimer is negative.  Patient had negative orthostatic vital signs after fluid resuscitation.  He has no evidence of arrhythmia or GI bleed. Question if the patient may have had vasovagal event.  I considered MRI however I do not feel the patient needs that at this time as he has no overt neurologic findings.   Patient feeling improved.  I feel he is safe for discharge at this time  Problems Addressed: Near syncope: acute illness or injury Stenosis of right internal carotid artery: undiagnosed new problem with uncertain prognosis  Amount and/or Complexity of Data Reviewed External Data Reviewed: labs, radiology and notes. Labs: ordered. Decision-making details documented in ED Course. Radiology: ordered and independent interpretation performed. Decision-making details documented in ED Course. ECG/medicine tests: independent interpretation performed. Decision-making details documented in ED Course.  Risk Prescription drug management.           Final Clinical Impression(s) / ED Diagnoses Final diagnoses:  Near syncope  Stenosis of right internal  carotid artery    Rx / DC Orders ED Discharge Orders     None         Margarita Mail, PA-C 08/18/21 1528    Tegeler, Gwenyth Allegra, MD 08/18/21 1538

## 2021-08-18 NOTE — ED Triage Notes (Signed)
Pt BIB [facility] from home after pt's wife witnessed pt stumble out of bed. Wife assisted pt to floor and noted pt to be unresponsive for 1 min. Per EMS pt was hypotensive in the 80s, pt given 437m NaCl, pt's last BP 140/76. Pt A&Ox4

## 2021-08-18 NOTE — Discharge Instructions (Signed)
Your labs showed NO evidence of a blood clot as the cause of your event Please follow up as directed.  Get help right away if: You faint. You have any of these symptoms that may indicate trouble with your heart: Fast or irregular heartbeats (palpitations). Unusual pain in your chest, abdomen, or back. Shortness of breath. You have a seizure. You have a severe headache. You are confused. You have vision problems. You have severe weakness or trouble walking. You are bleeding from your mouth or rectum, or have black or tarry stool.

## 2023-12-13 IMAGING — CT CT ANGIO HEAD-NECK (W OR W/O PERF)
3 of 11 series · 8 of 36 positions shown · IV contrast (APPLIED)
Comparison: None Available.

CLINICAL DATA: 60-year-old male "ataxia, head trauma, loss of
consciousness" .

EXAM:
CT ANGIOGRAPHY HEAD AND NECK
TECHNIQUE: Multidetector CT imaging of the head and neck was performed using
the standard protocol during bolus administration of intravenous
contrast. Multiplanar CT image reconstructions and MIPs were
obtained to evaluate the vascular anatomy. Carotid stenosis
measurements (when applicable) are obtained utilizing NASCET
criteria, using the distal internal carotid diameter as the
denominator.

[Series 9: cta neck/head (person_name) · axial · 0.61mm/px · z∈[-201,-75]mm · 2 of 189 slices shown]
[im 63/189  soft-tissue]
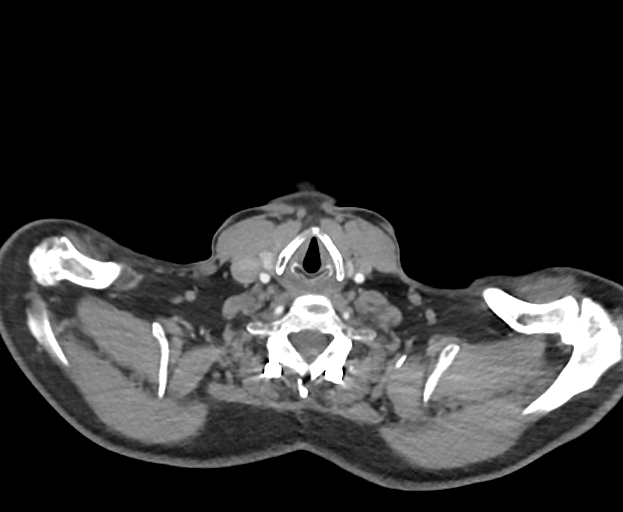
[im 126/189  soft-tissue]
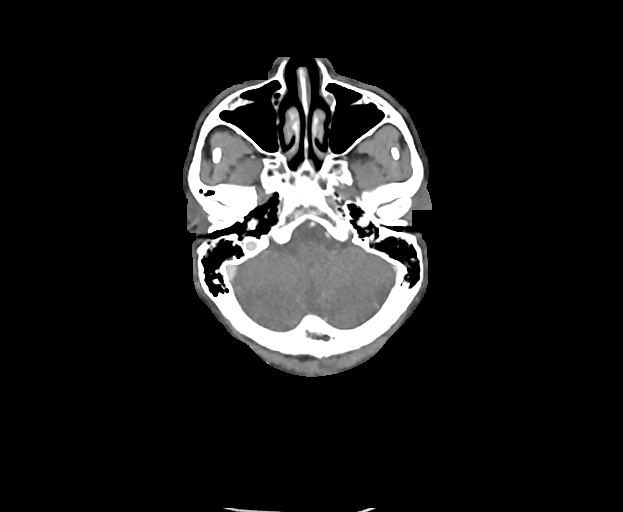

[Series 11: ax thins (person_name) · axial · 0.61mm/px · z∈[-262,-13]mm · 5 of 374 slices shown]
[im 63/374  soft-tissue]
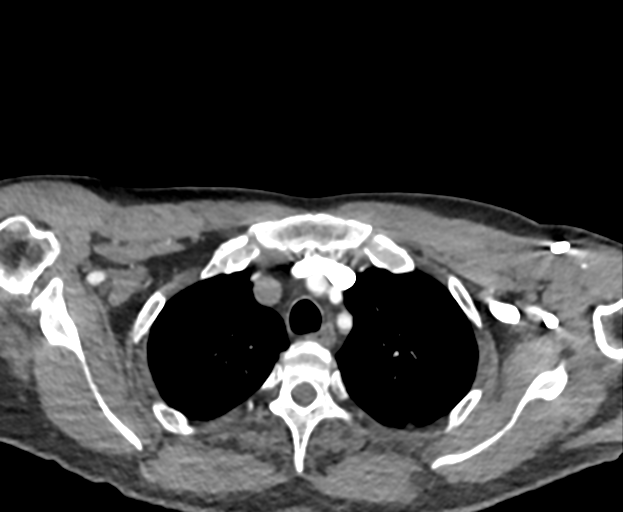
[im 125/374  bone]
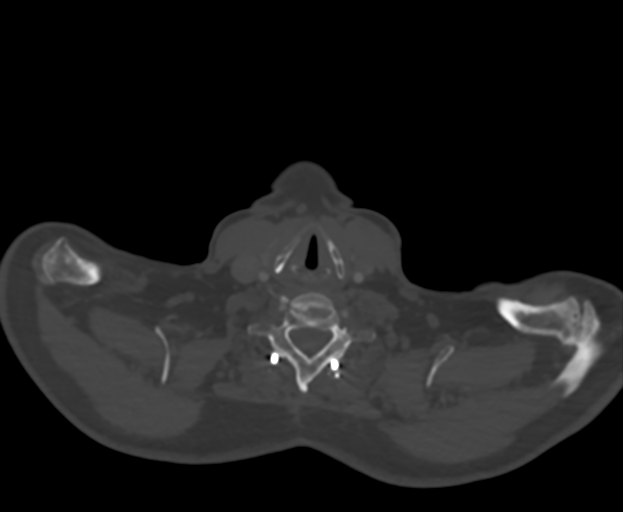
[im 187/374  soft-tissue]
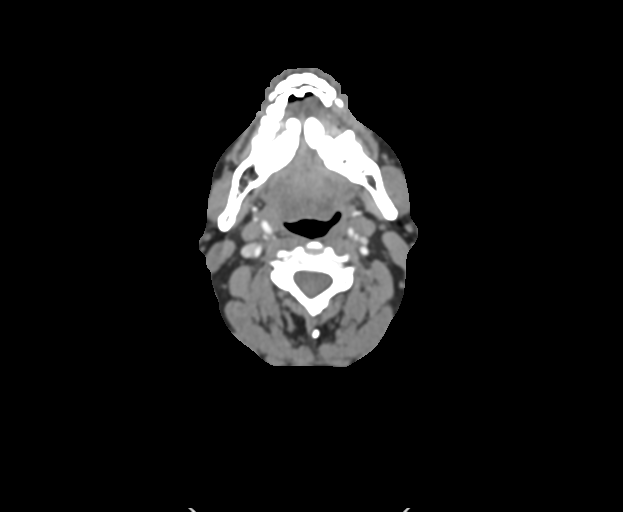
[im 249/374  bone]
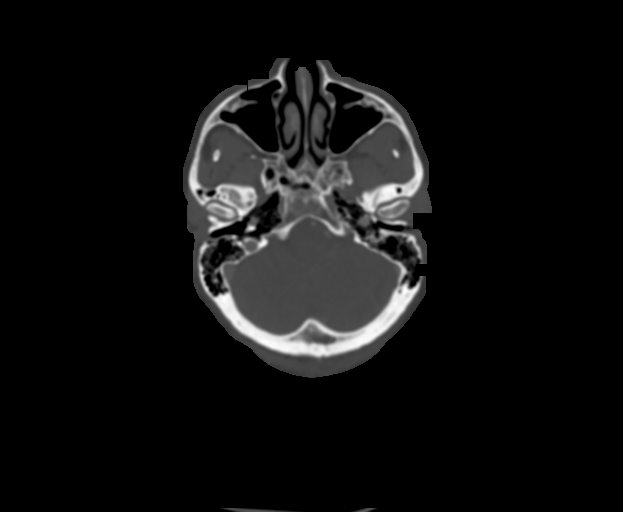
[im 311/374  soft-tissue]
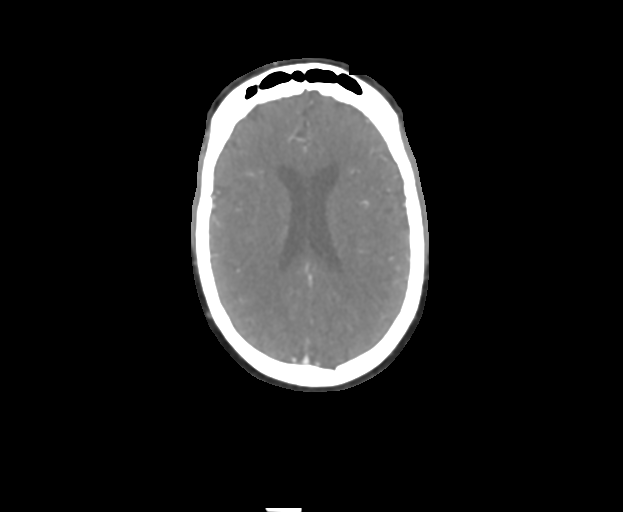

[Series 13: sag thins (person_name) · sagittal · 0.61mm/px · 1 of 382 slices shown]
[im 88/382  soft-tissue]
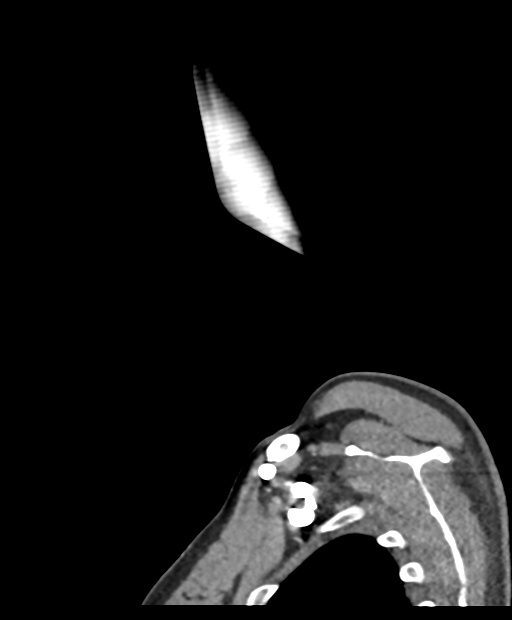

[8 of 36 positions shown; findings below may reference images not displayed]

RADIATION DOSE REDUCTION: This exam was performed according to the
departmental dose-optimization program which includes automated
exposure control, adjustment of the mA and/or kV according to
patient size and/or use of iterative reconstruction technique.

CONTRAST:  80mL OMNIPAQUE IOHEXOL 350 MG/ML SOLN
FINDINGS: CT HEAD

Brain: Calcified atherosclerosis at the skull base. Fairly normal
cerebral volume for age. No midline shift, ventriculomegaly, mass
effect, evidence of mass lesion, intracranial hemorrhage or evidence
of cortically based acute infarction. Gray-white matter
differentiation appears symmetric and within normal limits for age.

Calvarium and skull base: No acute osseous abnormality identified.

Paranasal sinuses: Mild to moderate paranasal sinus mucosal
thickening primarily in the frontoethmoidal recesses and ethmoid air
cells. Tympanic cavities, mastoid, petrous apex, and sphenoid wing
air cells are clear.

Orbits: No acute orbit or scalp soft tissue finding.

CTA NECK

Skeleton: Torus mandibularis, normal variant. Previous cervical
spine posterior decompression and fusion. No acute osseous
abnormality identified.

Upper chest: Minor dependent upper lung atelectasis. Visible central
pulmonary arteries appear patent. No superior mediastinal
lymphadenopathy.

Other neck: Chronic postoperative changes to the posterior
paraspinal soft tissues. No acute finding.

Aortic arch: 3 vessel arch configuration. Mild Calcified aortic
atherosclerosis.

Right carotid system: No significant brachiocephalic artery or right
CCA plaque or stenosis. But bulky soft more so than calcified plaque
at the right ICA origin and bulb. Stenosis up to 50 % with respect
to the distal vessel. Right ICA remains patent to the skull base.

Left carotid system: Minor plaque at the left CCA origin without
stenosis. Calcified plaque at the left ICA origin without stenosis.

Vertebral arteries:
Mild calcified plaque at the right subclavian artery origin without
stenosis. Normal right vertebral artery origin. Patent right
vertebral artery to the skull base with no significant plaque or
stenosis.

Mild plaque proximal left subclavian artery without stenosis. Normal
left vertebral artery origin. Codominant left vertebral artery is
patent to the skull base with no significant plaque or stenosis.

CTA HEAD

Posterior circulation: Patent distal vertebral arteries and
vertebrobasilar junction with no significant plaque or stenosis.
Bilateral AICA appear dominant and patent. Patent basilar artery
without stenosis. Patent SCA and PCA origins. Posterior
communicating arteries are diminutive or absent. Bilateral ACA
branches are within normal limits.

Anterior circulation: Both ICA siphons are patent. Mild left siphon
calcified plaque without stenosis. Mild to moderate right siphon
calcified plaque most pronounced in the cavernous segment. Mild
cavernous stenosis. Patent carotid termini, MCA and ACA origins.
Left A1 appears dominant. Normal anterior communicating artery.
Bilateral ACA branches are within normal limits. Left MCA M1 segment
and bifurcation are patent without stenosis. Right MCA M1 segment
and bifurcation are patent without stenosis. Bilateral MCA branches
are within normal limits.

Venous sinuses: Appear patent.

Anatomic variants: Dominant left ACA A1.

Review of the MIP images confirms the above findings
IMPRESSION: 1. Negative for large vessel occlusion.
Positive for atherosclerosis, most pronounced at the Right ICA
origin and bulb, but no hemodynamically significant stenosis.
2. Normal for age non contrast CT appearance of the brain.
3. Mild to moderate paranasal sinus inflammation.
4. Previous cervical spine decompression and fusion.
5. Aortic Atherosclerosis ([id number]).

## 2024-03-02 ENCOUNTER — Telehealth (HOSPITAL_BASED_OUTPATIENT_CLINIC_OR_DEPARTMENT_OTHER): Payer: Self-pay | Admitting: Internal Medicine

## 2024-03-02 ENCOUNTER — Telehealth: Payer: Self-pay | Admitting: Internal Medicine

## 2024-03-02 NOTE — Telephone Encounter (Signed)
 Copied from CRM #[id number]. Topic: Referral - Status >> Mar 02, 2024  1:03 PM Aleatha C wrote: Reason for CRM: Dr Charlott from [facility] referral patient for sports med for knee and hip and he hasn't heard anything , its been about 2 months since referral was placed and  please give patient a call and if he doesn't answer please leave message   We have not received a referral for this patient. Can you contact the patient to let him know and/or [facility] to get the referral?

## 2024-03-02 NOTE — Telephone Encounter (Signed)
 Called pt to inform him we have not yet received a referral from Dr. Candiss office at [facility]. Pt asked me to send him an email with correct fax number and an email address on file for them to send the referral to. I sent the pt the following in email.   Good afternoon,   If they can recheck to make sure this is the fax number, they have that would be great [phone]. If they would like they can also send the referral via email at [email or web address].   Thank you, Cena Molly

## 2024-04-12 ENCOUNTER — Encounter (HOSPITAL_BASED_OUTPATIENT_CLINIC_OR_DEPARTMENT_OTHER): Admitting: Family Medicine

## 2024-04-14 ENCOUNTER — Ambulatory Visit (INDEPENDENT_AMBULATORY_CARE_PROVIDER_SITE_OTHER): Admitting: Student

## 2024-04-14 ENCOUNTER — Ambulatory Visit (HOSPITAL_BASED_OUTPATIENT_CLINIC_OR_DEPARTMENT_OTHER)

## 2024-04-14 ENCOUNTER — Other Ambulatory Visit (HOSPITAL_BASED_OUTPATIENT_CLINIC_OR_DEPARTMENT_OTHER): Payer: Self-pay | Admitting: Student

## 2024-04-14 DIAGNOSIS — M25562 Pain in left knee: Secondary | ICD-10-CM

## 2024-04-14 DIAGNOSIS — M25552 Pain in left hip: Secondary | ICD-10-CM | POA: Diagnosis not present

## 2024-04-14 DIAGNOSIS — G8929 Other chronic pain: Secondary | ICD-10-CM

## 2024-04-14 DIAGNOSIS — M1612 Unilateral primary osteoarthritis, left hip: Secondary | ICD-10-CM | POA: Diagnosis not present

## 2024-04-14 MED ORDER — LIDOCAINE HCL 1 % IJ SOLN
4.0000 mL | INTRAMUSCULAR | Status: AC | PRN
  Administered 2024-04-14: 4 mL

## 2024-04-14 MED ORDER — BETAMETHASONE SOD PHOS & ACET 6 (3-3) MG/ML IJ SUSP
2.0000 mL | INTRAMUSCULAR | Status: AC | PRN
  Administered 2024-04-14: 2 mL via INTRA_ARTICULAR

## 2024-04-14 NOTE — Progress Notes (Signed)
 "                                Chief Complaint: Left hip and knee pain    Discussed the use of AI scribe software for clinical note transcription with the patient, who gave verbal consent to proceed.  History of Present Illness Kurt Cook is a 64 year old male with prior cervical spinal fusion who presents with progressive left hip and knee pain and stiffness. Over the past year he has had worsening intermittent sharp pain and tightness in the left hip and knee, with the knee also popping and clicking. Symptoms now limit his ability to stay active. He previously walked 2.5 miles at least four days a week and swam laps but has stopped due to caregiving demands for his mother in addition to joint symptoms. He has tried copper compression socks and knee braces with minimal relief. He has not had physical therapy, injections, or other targeted treatment for the hip or knee. He had resection of two schwannomas and C4-T1 cervical fusion 11 years ago with prolonged rehab for prior loss of ambulation and upper extremity function and is unsure if current symptoms relate to that surgery or his medications. He denies numbness or tingling in the left leg and significant back pain, but notes occasional numbness in the right leg.   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   Hypertension    Past Surgical History:  Procedure Laterality Date   CERVICAL SPINE SURGERY  07/14/2013   Removed 1 Tumor   TONSILLECTOMY     Social History   Socioeconomic History   Marital status: Married    Spouse name: Not on file   Number of children: Not on file   Years of education: Not on file   Highest education level: Not on file  Occupational History   Not on file  Tobacco Use   Smoking status: Never   Smokeless tobacco: Not on file  Substance and Sexual Activity    Alcohol use: Yes    Alcohol/week: 3.0 standard drinks of alcohol    Types: 3 Cans of beer per week   Drug use: No   Sexual activity: Not on file  Other Topics Concern   Not on file  Social History Narrative   Not on file   Social Drivers of Health   Tobacco Use: Unknown (08/18/2021)   Patient History    Smoking Tobacco Use: Never    Smokeless Tobacco Use: Unknown    Passive Exposure: Not on file  Financial Resource Strain: Not on file  Food Insecurity: Not on file  Transportation Needs: Not on file  Physical Activity: Not on file  Stress: Not on file  Social Connections: Not on file  Depression (EYV7-0): Not on file  Alcohol Screen: Not on file  Housing: Not on file  Utilities: Not on file  Health Literacy: Not on file   No family history on file. Allergies[1] Current Outpatient Medications  Medication Sig Dispense Refill   baclofen (LIORESAL) 10 MG tablet Take 10 mg by mouth 3 (three) times daily.     dexamethasone (DECADRON) 0.5 MG tablet Take 0.5 mg by mouth every 6 (six) hours.      gabapentin (NEURONTIN) 400 MG capsule Take 400 mg by mouth 2 (  two) times daily.     ibuprofen (ADVIL,MOTRIN) 200 MG tablet Take 600 mg by mouth daily as needed for moderate pain.     losartan (COZAAR) 100 MG tablet Take 100 mg by mouth daily.     Multiple Vitamin (MULTIVITAMIN) capsule Take 1 capsule by mouth daily.     Omega-3 Fatty Acids (FISH OIL PO) Take 1 capsule by mouth daily.     oxyCODONE (OXY IR/ROXICODONE) 5 MG immediate release tablet Take 5 mg by mouth every 4 (four) hours as needed for severe pain.      No current facility-administered medications for this visit.   No results found.  Review of Systems:   A ROS was performed including pertinent positives and negatives as documented in the HPI.  Physical Exam :   Constitutional: NAD and appears stated age Neurological: Alert and oriented Psych: Appropriate affect and cooperative There were no vitals taken for this visit.    Comprehensive Musculoskeletal Exam:    Exam of the left hip demonstrates limited passive range of motion to 110 degrees flexion, 20 degrees external rotation, and minimal internal rotation.  No tenderness over the greater trochanter or posterior hip.  No obvious deformity, effusion, erythema of the left knee.  There is active range of motion from 0 to 130 degrees with minimal crepitus.  No laxity with varus or valgus stress.  Imaging:   Xray (AP pelvis, left hip 3 views): Advanced left hip osteoarthritis with joint space narrowing, osteophyte formation, and subchondral sclerosis.  Moderate right hip degenerative changes.  Xray (left knee 4 views): No evidence of acute abnormality.  Mild medial and patellofemoral compartment degenerative changes.   I personally reviewed and interpreted the radiographs.      Assessment & Plan Severe osteoarthritis of left hip He has severe bone-on-bone osteoarthritis with subchondral cysts and joint space narrowing, making him a candidate for total hip arthroplasty for symptomatic relief and functional improvement. However, he is not ready for surgery due to caregiving responsibilities. We discussed the risks, alternatives, and potential symptom management difficulties if the arthritis progresses. An ultrasound-guided corticosteroid injection was performed on the left hip for symptomatic relief. Referral to hip replacement specialist Dr. Mariah was discussed for further evaluation and surgical planning when he is ready. Education on recovery and rehabilitation post-arthroplasty, including downtime and functional outcomes, was provided. Continued low-impact exercise, such as swimming, was encouraged to maintain joint mobility and strength. Have offered to facilitate a referral to the hip replacement specialist via the patient portal or phone call at his convenience.  Mild osteoarthritis of left knee He has mild osteoarthritis with preserved joint space and  minor osteophytic changes. Conservative management and injections are appropriate. Injection was performed today and he tolerated this well. Viscosupplementation was discussed as the next step if the steroid injection provides less than 3 months of relief, pending insurance approval. He was advised to continue using supportive measures, including compression socks and knee braces as tolerated.      Procedure Note  Patient: Kurt Cook             Date of Birth: 02-Dec-1960           MRN: [id number]             Visit Date: 04/14/2024  Procedures: Visit Diagnoses:  1. Unilateral primary osteoarthritis, left hip   2. Chronic pain of left knee     Large Joint Inj: L hip joint on 04/14/2024 2:18 PM Indications: pain Details: 22 G  3.5 in needle, anterolateral approach Medications: 4 mL lidocaine  1 %; 2 mL betamethasone  acetate-betamethasone  sodium phosphate 6 (3-3) MG/ML Outcome: tolerated well, no immediate complications Procedure, treatment alternatives, risks and benefits explained, specific risks discussed. Consent was given by the patient. Immediately prior to procedure a time out was called to verify the correct patient, procedure, equipment, support staff and site/side marked as required. Patient was prepped and draped in the usual sterile fashion.    Large Joint Inj: L knee on 04/14/2024 2:18 PM Indications: pain Details: 22 G 1.5 in needle, anterolateral approach Medications: 4 mL lidocaine  1 %; 2 mL betamethasone  acetate-betamethasone  sodium phosphate 6 (3-3) MG/ML Outcome: tolerated well, no immediate complications Procedure, treatment alternatives, risks and benefits explained, specific risks discussed. Consent was given by the patient. Immediately prior to procedure a time out was called to verify the correct patient, procedure, equipment, support staff and site/side marked as required. Patient was prepped and draped in the usual sterile fashion.       I personally saw  and evaluated the patient, and participated in the management and treatment plan.  Leonce Reveal, PA-C Orthopedics    [1] No Known Allergies  "
